# Patient Record
Sex: Female | Born: 2005 | Race: Black or African American | Hispanic: No | Marital: Single | Smoking: Never smoker
Health system: Southern US, Community
[De-identification: ages and names within clinical notes are randomized; demographics above are authoritative.]

## PROBLEM LIST (undated history)

## (undated) ENCOUNTER — Emergency Department (HOSPITAL_COMMUNITY): Payer: Managed Care, Other (non HMO) | Source: Home / Self Care

## (undated) DIAGNOSIS — J302 Other seasonal allergic rhinitis: Secondary | ICD-10-CM

---
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
## 2023-01-19 DIAGNOSIS — R030 Elevated blood-pressure reading, without diagnosis of hypertension: Secondary | ICD-10-CM | POA: Diagnosis not present

## 2023-01-19 DIAGNOSIS — S46912A Strain of unspecified muscle, fascia and tendon at shoulder and upper arm level, left arm, initial encounter: Secondary | ICD-10-CM | POA: Diagnosis not present

## 2023-04-22 ENCOUNTER — Ambulatory Visit
Admission: EM | Admit: 2023-04-22 | Discharge: 2023-04-22 | Disposition: A | Discharge status: Home / Self Care | Payer: Medicaid Other | Attending: Nurse Practitioner | Admitting: Nurse Practitioner

## 2023-04-22 DIAGNOSIS — M436 Torticollis: Secondary | ICD-10-CM | POA: Diagnosis not present

## 2023-04-22 LAB — POCT RAPID STREP A (OFFICE): Rapid Strep A Screen: NEGATIVE

## 2023-04-22 LAB — POCT MONO SCREEN (KUC): Mono, POC: NEGATIVE

## 2023-04-22 MED ORDER — CYCLOBENZAPRINE HCL 5 MG PO TABS: 5.0000 mg | ORAL_TABLET | Freq: Three times a day (TID) | ORAL | 0 refills | Status: DC | PRN

## 2023-04-22 NOTE — ED Provider Notes (Signed)
[facility]    CSN: [id number] Arrival date & time: 04/22/23  1656      History   Chief Complaint Chief Complaint  Patient presents with   neck swelling    HPI Shawna Garcia is a 17 y.o. female.   The history is provided by the patient.   The patient presents for complaints of swelling to the right side of her neck.  Patient states symptoms started this morning after waking up.  Patient states that she has pain when she turns her head to the left side.  She denies fever, chills, headache, sore throat, postnasal drainage, difficulty swallowing, injury or trauma, numbness or tingling in the upper extremities.  Patient states that she normally sleeps on 2 pillows, stating that 1 is a body pillow that she uses most of the night.  Patient reports that she has a history of getting "crick's" in her neck.  She states this is what her symptoms feel like, but not as bad.  Patient reports that she does not engage in oral sex.  Past Medical History:  Diagnosis Date   Seasonal allergies     Patient Active Problem List   Diagnosis Date Noted   Nexplanon insertion 01/04/2022   Seasonal allergies 09/27/2014    History reviewed. No pertinent surgical history.  OB History     Gravida  0   Para  0   Term  0   Preterm  0   AB  0   Living  0      SAB  0   IAB  0   Ectopic  0   Multiple  0   Live Births  0            Home Medications    Prior to Admission medications   Medication Sig Start Date End Date Taking? Authorizing Provider  cyclobenzaprine (FLEXERIL) 5 MG tablet Take 1 tablet (5 mg total) by mouth 3 (three) times daily as needed for muscle spasms. 04/22/23  Yes Reginaldo Hazard-Warren, Sadie Haber, NP    Family History Family History  Problem Relation Age of Onset   Hypertension Paternal Grandfather    Other Paternal Grandmother        internal bleeding   Hypertension Maternal Grandmother    Diabetes Maternal Grandmother    Hyperlipidemia  Maternal Grandmother    Hypertension Maternal Grandfather    Hyperlipidemia Maternal Grandfather    Healthy Father    Healthy Mother    Diabetes Paternal Uncle     Social History Social History   Tobacco Use   Smoking status: Never   Smokeless tobacco: Never  Vaping Use   Vaping Use: Never used  Substance Use Topics   Alcohol use: No   Drug use: No     Allergies   Patient has no known allergies.   Review of Systems Review of Systems Per HPI  Physical Exam Triage Vital Signs ED Triage Vitals [04/22/23 1703]  Enc Vitals Group     BP 115/78     Pulse Rate 79     Resp 20     Temp 99.3 F (37.4 C)     Temp Source Oral     SpO2 98 %     Weight      Height      Head Circumference      Peak Flow      Pain Score 5     Pain Loc      Pain Edu?  Excl. in GC?    No data found.  Updated Vital Signs BP 115/78 (BP Location: Right Arm)   Pulse 79   Temp 99.3 F (37.4 C) (Oral)   Resp 20   LMP 04/09/2023 (Approximate)   SpO2 98%   Visual Acuity Right Eye Distance:   Left Eye Distance:   Bilateral Distance:    Right Eye Near:   Left Eye Near:    Bilateral Near:     Physical Exam Vitals and nursing note reviewed.  Constitutional:      General: She is not in acute distress.    Appearance: Normal appearance.  HENT:     Head: Normocephalic.     Right Ear: Tympanic membrane, ear canal and external ear normal.     Left Ear: Tympanic membrane, ear canal and external ear normal.     Nose: Nose normal.     Mouth/Throat:     Mouth: Mucous membranes are moist.     Pharynx: Posterior oropharyngeal erythema present.  Eyes:     Extraocular Movements: Extraocular movements intact.     Conjunctiva/sclera: Conjunctivae normal.     Pupils: Pupils are equal, round, and reactive to light.  Cardiovascular:     Rate and Rhythm: Normal rate and regular rhythm.     Pulses: Normal pulses.     Heart sounds: Normal heart sounds.  Pulmonary:     Effort: Pulmonary  effort is normal. No respiratory distress.     Breath sounds: Normal breath sounds. No stridor. No wheezing, rhonchi or rales.  Abdominal:     General: Bowel sounds are normal.     Palpations: Abdomen is soft.     Tenderness: There is no abdominal tenderness.  Musculoskeletal:     Cervical back: Rigidity and tenderness present. No erythema. Pain with movement and muscular tenderness present. Decreased range of motion.  Skin:    General: Skin is warm and dry.  Neurological:     General: No focal deficit present.     Mental Status: She is alert and oriented to person, place, and time.  Psychiatric:        Mood and Affect: Mood normal.        Behavior: Behavior normal.      UC Treatments / Results  Labs (all labs ordered are listed, but only abnormal results are displayed) Labs Reviewed  POCT RAPID STREP A (OFFICE)  POCT MONO SCREEN ([facility])    EKG   Radiology No results found.  Procedures Procedures (including critical care time)  Medications Ordered in UC Medications - No data to display  Initial Impression / Assessment and Plan / UC Course  I have reviewed the triage vital signs and the nursing notes.  Pertinent labs & imaging results that were available during my care of the patient were reviewed by me and considered in my medical decision making (see chart for details).  The patient is well-appearing, she is in no acute distress, vital signs are stable.  Rapid strep test and monotest were negative.  Suspect neck stiffness related to acute torticollis.  Patient was treated with Flexeril 5 mg to help with spasm and stiffness.  Supportive care recommendations were provided and discussed with the patient to include use of over-the-counter analgesics for pain or discomfort, the use of ice or heat, and neck exercises.  Patient was given strict ER follow-up precautions.  Patient advised to follow-up with her PCP if symptoms do not improve.  Patient was in agreement with this  plan of care and verbalizes understanding.  All questions were answered.  Patient stable for discharge.  Final Clinical Impressions(s) / UC Diagnoses   Final diagnoses:  Neck stiffness     Discharge Instructions      The rapid strep test and monotest were negative.   Take medication as prescribed. May take over-the-counter ibuprofen as needed for pain or discomfort. Gentle range of motion of the head/neck to help with symptoms.  I have provided exercises for you to perform at least 2-3 times daily while symptoms persist.  You should be moving the head and neck every hour. May apply ice or heat as needed.  Apply ice for pain or swelling, heat for spasm or stiffness.  Apply for 20 minutes, remove for 1 hour, then repeat as needed. Make sure you sleeping to keep neck in alignment while symptoms persist. If you develop difficulty swallowing, trouble breathing, or other concerns, please go to the emergency department immediately. If symptoms do not improve, you will need to follow-up with your primary care physician for further evaluation. Follow-up as needed.     ED Prescriptions     Medication Sig Dispense Auth. Provider   cyclobenzaprine (FLEXERIL) 5 MG tablet Take 1 tablet (5 mg total) by mouth 3 (three) times daily as needed for muscle spasms. 20 tablet Melaya Hoselton-Warren, Sadie Haber, NP      PDMP not reviewed this encounter.   Abran Cantor, NP 04/22/23 1835

## 2023-04-22 NOTE — Discharge Instructions (Addendum)
The rapid strep test and monotest were negative.   Take medication as prescribed. May take over-the-counter ibuprofen as needed for pain or discomfort. Gentle range of motion of the head/neck to help with symptoms.  I have provided exercises for you to perform at least 2-3 times daily while symptoms persist.  You should be moving the head and neck every hour. May apply ice or heat as needed.  Apply ice for pain or swelling, heat for spasm or stiffness.  Apply for 20 minutes, remove for 1 hour, then repeat as needed. Make sure you sleeping to keep neck in alignment while symptoms persist. If you develop difficulty swallowing, trouble breathing, or other concerns, please go to the emergency department immediately. If symptoms do not improve, you will need to follow-up with your primary care physician for further evaluation. Follow-up as needed.

## 2023-04-22 NOTE — ED Triage Notes (Signed)
Pt reports she woke up with neck pain and swelling since this morning. No throat pain or ear pain. Cannot turn head to the left fully without pain.

## 2023-05-09 ENCOUNTER — Ambulatory Visit
Admission: EM | Admit: 2023-05-09 | Discharge: 2023-05-09 | Disposition: A | Discharge status: Home / Self Care | Payer: Medicaid Other | Attending: Family Medicine | Admitting: Family Medicine

## 2023-05-09 DIAGNOSIS — J039 Acute tonsillitis, unspecified: Secondary | ICD-10-CM

## 2023-05-09 LAB — POCT RAPID STREP A (OFFICE): Rapid Strep A Screen: NEGATIVE

## 2023-05-09 MED ORDER — DEXAMETHASONE SODIUM PHOSPHATE 10 MG/ML IJ SOLN
10.0000 mg | Freq: Once | INTRAMUSCULAR | Status: AC
  Administered 2023-05-09: 10 mg via INTRAMUSCULAR

## 2023-05-09 MED ORDER — AMOXICILLIN 875 MG PO TABS: 875.0000 mg | ORAL_TABLET | Freq: Two times a day (BID) | ORAL | 0 refills | Status: DC

## 2023-05-09 NOTE — ED Triage Notes (Signed)
Pt reports she has a sore throat that makes it hard to swallow and nasal congestion x 1 week.  Took cough meds and allergy meds but no relief

## 2023-05-09 NOTE — Discharge Instructions (Signed)
We have given you a shot of steroid today to help with the inflammation in your tonsils and your congestion.  Start the amoxicillin right away for suspected bacterial tonsillitis, take all 10 days as directed and change her toothbrush after about 2 days on the medication.  You are considered not contagious after 24 hours on the medication.

## 2023-05-09 NOTE — ED Provider Notes (Signed)
[facility]    CSN: [id number] Arrival date & time: 05/09/23  0803      History   Chief Complaint Chief Complaint  Patient presents with   Sore Throat    HPI Shawna Garcia is a 17 y.o. female.   Patient presenting today with 1 week history of significant sore throat, difficulty swallowing and runny nose.  Denies fever, chills, body aches, chest pain, shortness of breath, abdominal pain, nausea vomiting or diarrhea.  Has been consistently taking cold congestion medications and antihistamines and Flonase for her seasonal allergies with no relief.  No known sick contacts recently.    Past Medical History:  Diagnosis Date   Seasonal allergies     Patient Active Problem List   Diagnosis Date Noted   Nexplanon insertion 01/04/2022   Seasonal allergies 09/27/2014    History reviewed. No pertinent surgical history.  OB History     Gravida  0   Para  0   Term  0   Preterm  0   AB  0   Living  0      SAB  0   IAB  0   Ectopic  0   Multiple  0   Live Births  0            Home Medications    Prior to Admission medications   Medication Sig Start Date End Date Taking? Authorizing Provider  amoxicillin (AMOXIL) 875 MG tablet Take 1 tablet (875 mg total) by mouth 2 (two) times daily. 05/09/23  Yes Particia Nearing, PA-C  cyclobenzaprine (FLEXERIL) 5 MG tablet Take 1 tablet (5 mg total) by mouth 3 (three) times daily as needed for muscle spasms. 04/22/23   Leath-Warren, Sadie Haber, NP    Family History Family History  Problem Relation Age of Onset   Hypertension Paternal Grandfather    Other Paternal Grandmother        internal bleeding   Hypertension Maternal Grandmother    Diabetes Maternal Grandmother    Hyperlipidemia Maternal Grandmother    Hypertension Maternal Grandfather    Hyperlipidemia Maternal Grandfather    Healthy Father    Healthy Mother    Diabetes Paternal Uncle     Social History Social History   Tobacco  Use   Smoking status: Never   Smokeless tobacco: Never  Vaping Use   Vaping Use: Never used  Substance Use Topics   Alcohol use: No   Drug use: No     Allergies   Patient has no known allergies.   Review of Systems Review of Systems PER HPI  Physical Exam Triage Vital Signs ED Triage Vitals [05/09/23 0815]  Enc Vitals Group     BP 112/72     Pulse Rate (!) 102     Resp 18     Temp 99.2 F (37.3 C)     Temp Source Oral     SpO2 97 %     Weight      Height      Head Circumference      Peak Flow      Pain Score 8     Pain Loc      Pain Edu?      Excl. in GC?    No data found.  Updated Vital Signs BP 112/72 (BP Location: Right Arm)   Pulse (!) 102   Temp 99.2 F (37.3 C) (Oral)   Resp 18   LMP 04/09/2023 (Approximate)  SpO2 97%   Visual Acuity Right Eye Distance:   Left Eye Distance:   Bilateral Distance:    Right Eye Near:   Left Eye Near:    Bilateral Near:     Physical Exam Vitals and nursing note reviewed.  Constitutional:      Appearance: Normal appearance.  HENT:     Head: Atraumatic.     Right Ear: Tympanic membrane and external ear normal.     Left Ear: Tympanic membrane and external ear normal.     Nose: Congestion present.     Mouth/Throat:     Mouth: Mucous membranes are moist.     Pharynx: Oropharyngeal exudate and posterior oropharyngeal erythema present.     Comments: Significant tonsillar erythema, edema, copious exudates.  Uvula midline, orally mildly patent.  Patient in no acute distress Eyes:     Extraocular Movements: Extraocular movements intact.     Conjunctiva/sclera: Conjunctivae normal.  Neck:     Comments: Significant bilateral cervical adenopathy Cardiovascular:     Rate and Rhythm: Normal rate and regular rhythm.     Heart sounds: Normal heart sounds.  Pulmonary:     Effort: Pulmonary effort is normal.     Breath sounds: Normal breath sounds. No wheezing.  Musculoskeletal:        General: Normal range of  motion.     Cervical back: Normal range of motion and neck supple.  Lymphadenopathy:     Cervical: Cervical adenopathy present.  Skin:    General: Skin is warm and dry.  Neurological:     Mental Status: She is alert and oriented to person, place, and time.  Psychiatric:        Mood and Affect: Mood normal.        Thought Content: Thought content normal.      UC Treatments / Results  Labs (all labs ordered are listed, but only abnormal results are displayed) Labs Reviewed  POCT RAPID STREP A (OFFICE)    EKG   Radiology No results found.  Procedures Procedures (including critical care time)  Medications Ordered in UC Medications  dexamethasone (DECADRON) injection 10 mg (10 mg Intramuscular Given 05/09/23 0852)    Initial Impression / Assessment and Plan / UC Course  I have reviewed the triage vital signs and the nursing notes.  Pertinent labs & imaging results that were available during my care of the patient were reviewed by me and considered in my medical decision making (see chart for details).     Mildly tachycardic in triage, otherwise vital signs reassuring.  Exam very suspicious for a bacterial tonsillitis though rapid strep negative.  Treat with IM Decadron given extent of edema, amoxicillin, supportive over-the-counter medications and home care.  Return precautions reviewed.  Final Clinical Impressions(s) / UC Diagnoses   Final diagnoses:  Acute tonsillitis, unspecified etiology     Discharge Instructions      We have given you a shot of steroid today to help with the inflammation in your tonsils and your congestion.  Start the amoxicillin right away for suspected bacterial tonsillitis, take all 10 days as directed and change her toothbrush after about 2 days on the medication.  You are considered not contagious after 24 hours on the medication.    ED Prescriptions     Medication Sig Dispense Auth. Provider   amoxicillin (AMOXIL) 875 MG tablet Take  1 tablet (875 mg total) by mouth 2 (two) times daily. 20 tablet Particia Nearing, [place]  PDMP not reviewed this encounter.   Roosvelt Maser [place], [place] 05/09/23 [phone]

## 2023-06-05 DIAGNOSIS — H5213 Myopia, bilateral: Secondary | ICD-10-CM | POA: Diagnosis not present

## 2023-07-28 DIAGNOSIS — H5213 Myopia, bilateral: Secondary | ICD-10-CM | POA: Diagnosis not present

## 2023-08-07 ENCOUNTER — Ambulatory Visit: Payer: Medicaid Other

## 2023-08-07 ENCOUNTER — Ambulatory Visit
Admission: EM | Admit: 2023-08-07 | Discharge: 2023-08-07 | Disposition: A | Discharge status: Home / Self Care | Payer: Medicaid Other

## 2023-08-07 DIAGNOSIS — M25472 Effusion, left ankle: Secondary | ICD-10-CM | POA: Diagnosis not present

## 2023-08-07 DIAGNOSIS — M25572 Pain in left ankle and joints of left foot: Secondary | ICD-10-CM

## 2023-08-07 NOTE — ED Triage Notes (Signed)
Pt c/o left ankle pain and swelling, tender to the touch,  denies injury x 3 days ago started off with a throbbing. Swelling started Monday night while at work. Pt has been using Ibuprofen for pain and swelling last taken at 6:30 pm

## 2023-08-07 NOTE — Discharge Instructions (Addendum)
The xray did not show a fracture or dislocation. Wear the ankle brace that you have to allow for compression and support. Continue OTC Ibuprofen or Tylenol as needed for pain or discomfort. Continue RICE therapy, rest, ice, compression and elevation. Apply ice for 20 minutes, remove for one hour, repeat as needed. When at work, make sure you are wearing shoes with good insole and support.  It may be helpful to wear hightop sneakers/tennis shoes while symptoms persist. If symptoms have not improved over the next 2 - 3 weeks, follow-up with orthopedics for further evaluation. I have given you information for [facility] (you do not need to make an appointment) and for [facility] [place] (you will need to call to make an appointment). Follow-up as needed.

## 2023-08-07 NOTE — ED Provider Notes (Signed)
[facility]    CSN: [id number] Arrival date & time: 08/07/23  0809      History   Chief Complaint No chief complaint on file.   HPI Shawna Garcia is a 17 y.o. female.   The history is provided by the patient.   Patient presents with a 3-day history of left ankle pain and swelling.  Patient denies injury or trauma, bruising, redness, or inability to bear weight.  Reports that swelling worsens when she is up ambulating and on the ankle.  She denies numbness, tingling, or radiation of pain.  She states that she has sprained the ankle in the past, with the most recent incident occurring approximately 1 year ago.  Patient also reports that the symptoms "comes and goes", and has been doing so for quite some time.  She states "I thought it was the weather", but my symptoms did not improve.  Reports she has been taking ibuprofen, elevating the left lower extremity, and applying ice.  Past Medical History:  Diagnosis Date   Seasonal allergies     Patient Active Problem List   Diagnosis Date Noted   Nexplanon insertion 01/04/2022   Seasonal allergies 09/27/2014    History reviewed. No pertinent surgical history.  OB History     Gravida  0   Para  0   Term  0   Preterm  0   AB  0   Living  0      SAB  0   IAB  0   Ectopic  0   Multiple  0   Live Births  0            Home Medications    Prior to Admission medications   Medication Sig Start Date End Date Taking? Authorizing Provider  montelukast (SINGULAIR) 10 MG tablet Take 10 mg by mouth at bedtime. 05/15/19  Yes [provider]  amoxicillin (AMOXIL) 875 MG tablet Take 1 tablet (875 mg total) by mouth 2 (two) times daily. 05/09/23   Particia Nearing, PA-C  cetirizine (ZYRTEC ALLERGY) 10 MG tablet Take 10 mg by mouth daily.    [provider]  cyclobenzaprine (FLEXERIL) 5 MG tablet Take 1 tablet (5 mg total) by mouth 3 (three) times daily as needed for muscle spasms.  04/22/23   Mattox Schorr-Warren, Sadie Haber, NP  etonogestrel (NEXPLANON) 68 MG IMPL implant 68 mg by Subdermal route once.    [provider]  hydrocortisone 2.5 % ointment Apply topically 2 (two) times daily.    [provider]    Family History Family History  Problem Relation Age of Onset   Hypertension Paternal Grandfather    Other Paternal Grandmother        internal bleeding   Hypertension Maternal Grandmother    Diabetes Maternal Grandmother    Hyperlipidemia Maternal Grandmother    Hypertension Maternal Grandfather    Hyperlipidemia Maternal Grandfather    Healthy Father    Healthy Mother    Diabetes Paternal Uncle     Social History Social History   Tobacco Use   Smoking status: Never   Smokeless tobacco: Never  Vaping Use   Vaping status: Never Used  Substance Use Topics   Alcohol use: No   Drug use: No     Allergies   Patient has no known allergies.   Review of Systems Review of Systems Per HPI  Physical Exam Triage Vital Signs ED Triage Vitals  Encounter Vitals Group  BP 08/07/23 0821 129/82     Systolic BP Percentile --      Diastolic BP Percentile --      Pulse Rate 08/07/23 0821 76     Resp 08/07/23 0821 16     Temp 08/07/23 0821 98.2 F (36.8 C)     Temp Source 08/07/23 0821 Oral     SpO2 08/07/23 0821 98 %     Weight --      Height --      Head Circumference --      Peak Flow --      Pain Score 08/07/23 0825 6     Pain Loc --      Pain Education --      Exclude from Growth Chart --    No data found.  Updated Vital Signs BP 129/82 (BP Location: Right Arm)   Pulse 76   Temp 98.2 F (36.8 C) (Oral)   Resp 16   LMP 07/17/2023 (Exact Date)   SpO2 98%   Visual Acuity Right Eye Distance:   Left Eye Distance:   Bilateral Distance:    Right Eye Near:   Left Eye Near:    Bilateral Near:     Physical Exam Vitals and nursing note reviewed.  Constitutional:      General: She is not in acute distress.     Appearance: Normal appearance.  Eyes:     Extraocular Movements: Extraocular movements intact.     Pupils: Pupils are equal, round, and reactive to light.  Pulmonary:     Effort: Pulmonary effort is normal.  Musculoskeletal:     Cervical back: Normal range of motion.     Left ankle: Swelling present. No deformity or ecchymosis. Tenderness present over the lateral malleolus. Decreased range of motion.  Skin:    General: Skin is warm and dry.  Neurological:     General: No focal deficit present.     Mental Status: She is alert and oriented to person, place, and time.  Psychiatric:        Mood and Affect: Mood normal.        Behavior: Behavior normal.      UC Treatments / Results  Labs (all labs ordered are listed, but only abnormal results are displayed) Labs Reviewed - No data to display  EKG   Radiology DG Ankle Complete Left  Result Date: 08/07/2023 CLINICAL DATA:  Left ankle pain and swelling EXAM: LEFT ANKLE COMPLETE - 3+ VIEW COMPARISON:  05/02/2022 FINDINGS: There is no evidence of fracture, dislocation, or joint effusion. There is no evidence of arthropathy or other focal bone abnormality. Soft tissues are unremarkable. IMPRESSION: Negative. Electronically Signed   By: Judie Petit.  Shick M.D.   On: 08/07/2023 09:07    Procedures Procedures (including critical care time)  Medications Ordered in UC Medications - No data to display  Initial Impression / Assessment and Plan / UC Course  I have reviewed the triage vital signs and the nursing notes.  Pertinent labs & imaging results that were available during my care of the patient were reviewed by me and considered in my medical decision making (see chart for details).  The patient is well-appearing, she is in no acute distress, vital signs are stable.  X-ray of the left ankle is negative for fracture or dislocation.  She does have mild swelling over the lateral malleolus, but has good range of motion.  Difficult to ascertain  the cause of the patient's symptoms at this time.  Patient reports that she does have an ankle brace at home, advised patient to wear the ankle brace to allow for additional compression and support.  Supportive care recommendations were provided and discussed with the patient to include continuing over-the-counter analgesics, and RICE therapy.  Patient was advised that if symptoms are not improving over the next 2 to 3 weeks, or appear to be worsening, would like for her to follow-up with orthopedics.  Patient was given information for [facility] and for [facility].  Patient is in agreement with this plan of care and verbalizes understanding.  All questions were answered.  Patient stable for discharge.  Final Clinical Impressions(s) / UC Diagnoses   Final diagnoses:  Pain and swelling of left ankle     Discharge Instructions      The xray did not show a fracture or dislocation. Wear the ankle brace that you have to allow for compression and support. Continue OTC Ibuprofen or Tylenol as needed for pain or discomfort. Continue RICE therapy, rest, ice, compression and elevation. Apply ice for 20 minutes, remove for one hour, repeat as needed. When at work, make sure you are wearing shoes with good insole and support.  It may be helpful to wear hightop sneakers/tennis shoes while symptoms persist. If symptoms have not improved over the next 2 - 3 weeks, follow-up with orthopedics for further evaluation. I have given you information for [facility] (you do not need to make an appointment) and for [facility] (you will need to call to make an appointment). Follow-up as needed.     ED Prescriptions   None    PDMP not reviewed this encounter.   Abran Cantor, NP 08/07/23 [phone]

## 2023-09-02 ENCOUNTER — Encounter: Payer: Self-pay | Admitting: Advanced Practice Midwife

## 2023-09-02 ENCOUNTER — Ambulatory Visit: Payer: Medicaid Other | Admitting: Advanced Practice Midwife

## 2023-09-02 VITALS — BP 121/81 | HR 72 | Ht 72.0 in | Wt 220.0 lb

## 2023-09-02 DIAGNOSIS — Z30011 Encounter for initial prescription of contraceptive pills: Secondary | ICD-10-CM

## 2023-09-02 DIAGNOSIS — Z3046 Encounter for surveillance of implantable subdermal contraceptive: Secondary | ICD-10-CM | POA: Diagnosis not present

## 2023-09-02 DIAGNOSIS — Z30017 Encounter for initial prescription of implantable subdermal contraceptive: Secondary | ICD-10-CM

## 2023-09-02 MED ORDER — NORETHIN-ETH ESTRAD-FE BIPHAS 1 MG-10 MCG / 10 MCG PO TABS: 1.0000 | ORAL_TABLET | Freq: Every day | ORAL | 4 refills | Status: DC

## 2023-09-02 NOTE — Progress Notes (Signed)
HPI:  Shawna Garcia 17 y.o. here for Nexplanon removal.  She bleeds heavier and longer with it than without it.  Discussed options and goals: would like her periods lighter or gone altogether. Her future plans for birth control are COCs.  Past Medical History: Past Medical History:  Diagnosis Date   Seasonal allergies     Past Surgical History: History reviewed. No pertinent surgical history.  Family History: Family History  Problem Relation Age of Onset   Hypertension Paternal Grandfather    Other Paternal Grandmother        internal bleeding   Hypertension Maternal Grandmother    Diabetes Maternal Grandmother    Hyperlipidemia Maternal Grandmother    Hypertension Maternal Grandfather    Hyperlipidemia Maternal Grandfather    Healthy Father    Healthy Mother    Diabetes Paternal Uncle     Social History: Social History   Tobacco Use   Smoking status: Never   Smokeless tobacco: Never  Vaping Use   Vaping status: Never Used  Substance Use Topics   Alcohol use: No   Drug use: No    Allergies: No Known Allergies  Meds:none    Patient given informed consent for removal of her Nexplanon, time out was performed.  Signed copy in the chart.  Appropriate time out taken. Implanon site identified.  Area prepped in usual sterile fashon. One cc of 1% lidocaine was used to anesthetize the area at the distal end of the implant. A small stab incision was made right beside the implant on the distal portion.  The Nexplanon rod was grasped using hemostats and removed without difficulty.  There was less than 3 cc blood loss. There were no complications.  A small amount of antibiotic ointment and steri-strips were applied over the small incision.  A pressure bandage was applied to reduce any bruising.  The patient tolerated the procedure well and was given post procedure instructions.  Rx for  Meds ordered this encounter  Medications   Norethindrone-Ethinyl Estradiol-Fe Biphas (LO  LOESTRIN FE) 1 MG-10 MCG / 10 MCG tablet    Sig: Take 1 tablet by mouth daily.    Dispense:  84 tablet    Refill:  4    Order Specific Question:   Supervising Provider    Answer:   Lazaro Arms [[id number]]   Start today. F/U 3 months

## 2023-09-16 ENCOUNTER — Ambulatory Visit
Admission: EM | Admit: 2023-09-16 | Discharge: 2023-09-16 | Disposition: A | Discharge status: Home / Self Care | Payer: Medicaid Other | Attending: Family Medicine | Admitting: Family Medicine

## 2023-09-16 DIAGNOSIS — S39012A Strain of muscle, fascia and tendon of lower back, initial encounter: Secondary | ICD-10-CM | POA: Diagnosis not present

## 2023-09-16 MED ORDER — DEXAMETHASONE SODIUM PHOSPHATE 10 MG/ML IJ SOLN
10.0000 mg | Freq: Once | INTRAMUSCULAR | Status: AC
Start: 2023-09-16 — End: 2023-09-16
  Administered 2023-09-16: 10 mg via INTRAMUSCULAR

## 2023-09-16 NOTE — ED Triage Notes (Signed)
Pt c/o back pain that's radiating down into legs x 3 days, pt denies any injury to her back to cause the pain. Has tingling in the legs. Staes she has tried Ice/heat, muscle relaxants and tylenol/ Ibuprofen. Nothing has alleviated the pain she is feeling.

## 2023-09-16 NOTE — ED Provider Notes (Signed)
[facility]    CSN: [id number] Arrival date & time: 09/16/23  1014      History   Chief Complaint No chief complaint on file.   HPI Shawna Garcia is a 17 y.o. female.   Patient presenting today with 3 history of bilateral low back pain radiating down the back of both legs.  Denies known injury, bowel or bladder incontinence, saddle anesthesias, leg weakness numbness or tingling, fever.  So far trying over-the-counter pain relievers, muscle relaxers with minimal relief.  Denies past history of the same.    Past Medical History:  Diagnosis Date   Seasonal allergies     Patient Active Problem List   Diagnosis Date Noted   Seasonal allergies 09/27/2014    History reviewed. No pertinent surgical history.  OB History     Gravida  0   Para  0   Term  0   Preterm  0   AB  0   Living  0      SAB  0   IAB  0   Ectopic  0   Multiple  0   Live Births  0            Home Medications    Prior to Admission medications   Medication Sig Start Date End Date Taking? Authorizing Provider  amoxicillin (AMOXIL) 875 MG tablet Take 1 tablet (875 mg total) by mouth 2 (two) times daily. 05/09/23   Particia Nearing, PA-C  cetirizine (ZYRTEC ALLERGY) 10 MG tablet Take 10 mg by mouth daily.    [provider]  cyclobenzaprine (FLEXERIL) 5 MG tablet Take 1 tablet (5 mg total) by mouth 3 (three) times daily as needed for muscle spasms. Patient not taking: Reported on 09/02/2023 04/22/23   Leath-Warren, Sadie Haber, NP  hydrocortisone 2.5 % ointment Apply topically 2 (two) times daily.    [provider]  montelukast (SINGULAIR) 10 MG tablet Take 10 mg by mouth at bedtime. Patient not taking: Reported on 09/02/2023 05/15/19   [provider]  Norethindrone-Ethinyl Estradiol-Fe Biphas (LO LOESTRIN FE) 1 MG-10 MCG / 10 MCG tablet Take 1 tablet by mouth daily. 09/02/23   Cresenzo-Dishmon, Scarlette Calico, CNM    Family History Family  History  Problem Relation Age of Onset   Hypertension Paternal Grandfather    Other Paternal Grandmother        internal bleeding   Hypertension Maternal Grandmother    Diabetes Maternal Grandmother    Hyperlipidemia Maternal Grandmother    Hypertension Maternal Grandfather    Hyperlipidemia Maternal Grandfather    Healthy Father    Healthy Mother    Diabetes Paternal Uncle     Social History Social History   Tobacco Use   Smoking status: Never   Smokeless tobacco: Never  Vaping Use   Vaping status: Never Used  Substance Use Topics   Alcohol use: No   Drug use: No     Allergies   Patient has no known allergies.   Review of Systems Review of Systems Per HPI  Physical Exam Triage Vital Signs ED Triage Vitals  Encounter Vitals Group     BP 09/16/23 1045 120/80     Systolic BP Percentile --      Diastolic BP Percentile --      Pulse Rate 09/16/23 1045 71     Resp 09/16/23 1045 19     Temp 09/16/23 1045 98.3 F (36.8 C)     Temp Source 09/16/23 1045  Oral     SpO2 09/16/23 1045 98 %     Weight --      Height --      Head Circumference --      Peak Flow --      Pain Score 09/16/23 1049 8     Pain Loc --      Pain Education --      Exclude from Growth Chart --    No data found.  Updated Vital Signs BP 120/80 (BP Location: Right Arm)   Pulse 71   Temp 98.3 F (36.8 C) (Oral)   Resp 19   LMP 08/14/2023 (Exact Date)   SpO2 98%   Visual Acuity Right Eye Distance:   Left Eye Distance:   Bilateral Distance:    Right Eye Near:   Left Eye Near:    Bilateral Near:     Physical Exam Vitals and nursing note reviewed.  Constitutional:      Appearance: Normal appearance. She is not ill-appearing.  HENT:     Head: Atraumatic.  Eyes:     Extraocular Movements: Extraocular movements intact.     Conjunctiva/sclera: Conjunctivae normal.  Cardiovascular:     Rate and Rhythm: Normal rate and regular rhythm.     Heart sounds: Normal heart sounds.   Pulmonary:     Effort: Pulmonary effort is normal.     Breath sounds: Normal breath sounds.  Musculoskeletal:        General: Tenderness present. No swelling, deformity or signs of injury. Normal range of motion.     Cervical back: Normal range of motion and neck supple.     Comments: No midline spinal tenderness to palpation diffusely.  Bilateral lumbar musculature laterally tender to palpation.  Negative straight leg raise bilateral lower extremities.  Normal gait and range of motion  Skin:    General: Skin is warm and dry.  Neurological:     Mental Status: She is alert and oriented to person, place, and time.     Motor: No weakness.     Gait: Gait normal.     Comments: Bilateral lower extremities neurovascularly intact  Psychiatric:        Mood and Affect: Mood normal.        Thought Content: Thought content normal.        Judgment: Judgment normal.      UC Treatments / Results  Labs (all labs ordered are listed, but only abnormal results are displayed) Labs Reviewed - No data to display  EKG   Radiology No results found.  Procedures Procedures (including critical care time)  Medications Ordered in UC Medications  dexamethasone (DECADRON) injection 10 mg (10 mg Intramuscular Given 09/16/23 1116)    Initial Impression / Assessment and Plan / UC Course  I have reviewed the triage vital signs and the nursing notes.  Pertinent labs & imaging results that were available during my care of the patient were reviewed by me and considered in my medical decision making (see chart for details).     Consistent with muscular strain, piriformis syndrome.  Treat with IM Decadron, continued muscle relaxers and over-the-counter pain relievers.  Discussed supportive home care and return precautions.  Final Clinical Impressions(s) / UC Diagnoses   Final diagnoses:  Strain of lumbar region, initial encounter   Discharge Instructions   None    ED Prescriptions   None     PDMP not reviewed this encounter.   Particia Nearing, [place] 09/16/23 1349

## 2023-09-23 ENCOUNTER — Encounter: Payer: Self-pay | Admitting: Advanced Practice Midwife

## 2023-10-29 ENCOUNTER — Encounter: Payer: Self-pay | Admitting: Advanced Practice Midwife

## 2023-12-03 ENCOUNTER — Encounter: Payer: Medicaid Other | Admitting: Women's Health

## 2023-12-03 ENCOUNTER — Encounter: Payer: Self-pay | Admitting: Women's Health

## 2023-12-04 ENCOUNTER — Encounter: Payer: Self-pay | Admitting: Women's Health

## 2023-12-04 ENCOUNTER — Ambulatory Visit (INDEPENDENT_AMBULATORY_CARE_PROVIDER_SITE_OTHER): Payer: Self-pay | Admitting: Women's Health

## 2023-12-04 VITALS — BP 121/73 | Ht 72.0 in | Wt 249.0 lb

## 2023-12-04 DIAGNOSIS — Z3041 Encounter for surveillance of contraceptive pills: Secondary | ICD-10-CM

## 2023-12-04 DIAGNOSIS — N946 Dysmenorrhea, unspecified: Secondary | ICD-10-CM

## 2023-12-04 DIAGNOSIS — N920 Excessive and frequent menstruation with regular cycle: Secondary | ICD-10-CM

## 2023-12-04 NOTE — Progress Notes (Signed)
   GYN VISIT Patient name: Shawna Garcia MRN [id number]  Date of birth: 20-Dec-2005 Chief Complaint:   Follow-up (Birth control)  History of Present Illness:   Shawna Garcia is a 18 y.o. G0P0000 African-American female being seen today for f/u on COCs. Started on LoLo 10/28 at time of Nexplanon removal.  Had Nexplanon placed 01/05/23 to help heavy painful bleeding and unfortunately it did the opposite. Periods have already started to become lighter since beginning LoLo.  Patient's last menstrual period was 11/24/2023. The current method of family planning is OCP (estrogen/progesterone).  Last pap <21yo. Results were: N/A     01/04/2022    8:55 AM  Depression screen PHQ 2/9  Decreased Interest 0  Down, Depressed, Hopeless 0  PHQ - 2 Score 0  Altered sleeping 0  Tired, decreased energy 0  Change in appetite 0  Feeling bad or failure about yourself  0  Trouble concentrating 1  Moving slowly or fidgety/restless 0  Suicidal thoughts 0  PHQ-9 Score 1        01/04/2022    8:56 AM  GAD 7 : Generalized Anxiety Score  Nervous, Anxious, on Edge 0  Control/stop worrying 0  Worry too much - different things 0  Trouble relaxing 0  Restless 0  Easily annoyed or irritable 0  Afraid - awful might happen 0  Total GAD 7 Score 0     Review of Systems:   Pertinent items are noted in HPI Denies fever/chills, dizziness, headaches, visual disturbances, fatigue, shortness of breath, chest pain, abdominal pain, vomiting, abnormal vaginal discharge/itching/odor/irritation, problems with periods, bowel movements, urination, or intercourse unless otherwise stated above.  Pertinent History Reviewed:  Reviewed past medical,surgical, social, obstetrical and family history.  Reviewed problem list, medications and allergies. Physical Assessment:   Vitals:   12/04/23 1417  BP: 121/73  Weight: 249 lb (112.9 kg)  Height: 6' (1.829 m)  Body mass index is 33.77 kg/m.       Physical Examination:    General appearance: alert, well appearing, and in no distress  Mental status: alert, oriented to person, place, and time  Skin: warm & dry   Cardiovascular: normal heart rate noted  Respiratory: normal respiratory effort, no distress  Abdomen: soft, non-tender   Pelvic: examination not indicated  Extremities: no edema   Chaperone: N/A    No results found for this or any previous visit (from the past 24 hours).  Assessment & Plan:  1) Heavy painful periods> improving on LoLo, f/u 51yr  Meds: No orders of the defined types were placed in this encounter.   No orders of the defined types were placed in this encounter.   Return in about 1 year (around 12/03/2024) for med f/u.  Cheral Marker CNM, [facility] 12/04/2023 2:47 PM

## 2023-12-04 NOTE — Progress Notes (Signed)
This encounter was created in error - please disregard. Appt for COC f/u, didn't have bp cuff so will reschedule for in person.

## 2024-01-08 ENCOUNTER — Ambulatory Visit (INDEPENDENT_AMBULATORY_CARE_PROVIDER_SITE_OTHER): Payer: Self-pay

## 2024-01-08 ENCOUNTER — Encounter: Payer: Self-pay | Admitting: Emergency Medicine

## 2024-01-08 ENCOUNTER — Ambulatory Visit
Admission: EM | Admit: 2024-01-08 | Discharge: 2024-01-08 | Disposition: A | Discharge status: Home / Self Care | Payer: Self-pay | Attending: Nurse Practitioner | Admitting: Nurse Practitioner

## 2024-01-08 DIAGNOSIS — S6991XA Unspecified injury of right wrist, hand and finger(s), initial encounter: Secondary | ICD-10-CM

## 2024-01-08 NOTE — ED Provider Notes (Signed)
 [facility]    CSN: [id number] Arrival date & time: 01/08/24  0804      History   Chief Complaint No chief complaint on file.   HPI Shawna Garcia is a 18 y.o. female.   The history is provided by the patient.   Patient presents for complaints of right wrist pain that is been present for the past 1 to 2 weeks.  Patient states that she fell onto the right wrist with an outstretched hand.  She states since that time, she has had intermittent swelling and pain.  She states the pain starts approximately midway on top of the wrist and radiates about midway down under the wrist on the side closest to her.  States that she initially used an Ace wrap for compression, but felt that her wrist was moving too much, and then transition to a brace.  She reports mild numbness in the right hand.  Denies tingling, decreased movement in the fingers, or decrease sensation.  Patient reports she is right-hand dominant. Past Medical History:  Diagnosis Date   Seasonal allergies     Patient Active Problem List   Diagnosis Date Noted   Seasonal allergies 09/27/2014    History reviewed. No pertinent surgical history.  OB History     Gravida  0   Para  0   Term  0   Preterm  0   AB  0   Living  0      SAB  0   IAB  0   Ectopic  0   Multiple  0   Live Births  0            Home Medications    Prior to Admission medications   Medication Sig Start Date End Date Taking? Authorizing Provider  cetirizine (ZYRTEC ALLERGY) 10 MG tablet Take 10 mg by mouth daily.    [provider]  Norethindrone-Ethinyl Estradiol-Fe Biphas (LO LOESTRIN FE) 1 MG-10 MCG / 10 MCG tablet Take 1 tablet by mouth daily. 09/02/23   Cresenzo-Dishmon, Scarlette Calico, CNM    Family History Family History  Problem Relation Age of Onset   Hypertension Paternal Grandfather    Other Paternal Grandmother        internal bleeding   Hypertension Maternal Grandmother    Diabetes Maternal  Grandmother    Hyperlipidemia Maternal Grandmother    Hypertension Maternal Grandfather    Hyperlipidemia Maternal Grandfather    Healthy Father    Healthy Mother    Diabetes Paternal Uncle     Social History Social History   Tobacco Use   Smoking status: Never   Smokeless tobacco: Never  Vaping Use   Vaping status: Never Used  Substance Use Topics   Alcohol use: No   Drug use: No     Allergies   Patient has no known allergies.   Review of Systems Review of Systems Per HPI  Physical Exam Triage Vital Signs ED Triage Vitals  Encounter Vitals Group     BP 01/08/24 0821 122/84     Systolic BP Percentile --      Diastolic BP Percentile --      Pulse Rate 01/08/24 0821 70     Resp 01/08/24 0821 18     Temp 01/08/24 0821 98.6 F (37 C)     Temp Source 01/08/24 0821 Oral     SpO2 01/08/24 0821 98 %     Weight --      Height --  Head Circumference --      Peak Flow --      Pain Score 01/08/24 0822 8     Pain Loc --      Pain Education --      Exclude from Growth Chart --    No data found.  Updated Vital Signs BP 122/84 (BP Location: Right Arm)   Pulse 70   Temp 98.6 F (37 C) (Oral)   Resp 18   LMP 12/14/2023 (Approximate)   SpO2 98%   Visual Acuity Right Eye Distance:   Left Eye Distance:   Bilateral Distance:    Right Eye Near:   Left Eye Near:    Bilateral Near:     Physical Exam Vitals and nursing note reviewed.  Constitutional:      General: She is not in acute distress.    Appearance: Normal appearance.  Eyes:     Extraocular Movements: Extraocular movements intact.     Pupils: Pupils are equal, round, and reactive to light.  Cardiovascular:     Rate and Rhythm: Normal rate and regular rhythm.     Pulses: Normal pulses.     Heart sounds: Normal heart sounds.  Pulmonary:     Effort: Pulmonary effort is normal. No respiratory distress.     Breath sounds: Normal breath sounds. No stridor. No wheezing, rhonchi or rales.  Abdominal:      General: Bowel sounds are normal.     Palpations: Abdomen is soft.     Tenderness: There is no abdominal tenderness.  Musculoskeletal:     Right wrist: Tenderness (Generalized) present. No swelling, deformity or snuff box tenderness. Decreased range of motion (Passive range of motion with pain). Normal pulse.     Right hand: Normal.     Cervical back: Normal range of motion.  Lymphadenopathy:     Cervical: No cervical adenopathy.  Skin:    General: Skin is warm and dry.  Neurological:     General: No focal deficit present.     Mental Status: She is alert and oriented to person, place, and time.  Psychiatric:        Mood and Affect: Mood normal.        Behavior: Behavior normal.      UC Treatments / Results  Labs (all labs ordered are listed, but only abnormal results are displayed) Labs Reviewed - No data to display  EKG   Radiology No results found.  Procedures Procedures (including critical care time)  Medications Ordered in UC Medications - No data to display  Initial Impression / Assessment and Plan / UC Course  I have reviewed the triage vital signs and the nursing notes.  Pertinent labs & imaging results that were available during my care of the patient were reviewed by me and considered in my medical decision making (see chart for details).  X-ray of the right wrist is pending.  Preliminary read of x-ray did not show any obvious fractures, awaiting official read from radiologist.  At a minimum, suspect a sprain of the right wrist.  Supportive care recommendations were provided and discussed with the patient to include RICE therapy, and over-the-counter analgesics.  Patient advised to continue use of the brace.  Range of motion exercises were also provided.  Discussed indications for follow-up with the patient.  Patient was given information for Ortho care and for [facility] if symptoms fail to improve.  Patient was in agreement with this plan of care and  verbalized understanding.  All  questions were answered.  Patient stable for discharge.  Work note was provided.  Update 0943am: Reviewed x-ray results.  Call patient to discuss, verified patient with 2 patient identifiers.  Advised patient that x-ray was negative for fracture or dislocation.  Advised patient we will continue with current plan treatment plan.  Advised patient's that if symptoms do not improve, or appear to be worsening, follow-up with orthopedics as planned.  Patient was in agreement with this plan of care and verbalized understanding.  All questions were answered.  Final Clinical Impressions(s) / UC Diagnoses   Final diagnoses:  None   Discharge Instructions   None    ED Prescriptions   None    PDMP not reviewed this encounter.   Abran Cantor, NP 01/08/24 [phone]

## 2024-01-08 NOTE — ED Triage Notes (Signed)
 Right wrist pain x 2 weeks.  Fell on a cement floor 2 weeks ago.  Has been wearing a wrist brace and icing without relief.

## 2024-01-08 NOTE — Discharge Instructions (Signed)
 X-ray of the right wrist is pending.  You will be contacted when the results of the x-ray are received.  You also access to the results via MyChart. May take over-the-counter Tylenol or ibuprofen as needed for pain, or general discomfort. Continue using the brace when you are engaged in prolonged or strenuous activity.  Remove the brace throughout the day and perform gentle range of motion exercises to help decrease your recovery time.  Avoid heavy lifting while symptoms persist. RICE therapy, rest, ice, compression, elevation.  Apply ice for 20 minutes, remove for 1 hour, repeat as needed. If symptoms are failing to improve over the next 1 to 2 weeks, or appear to be worsening, would like for you to follow-up with orthopedics for further evaluation. Follow-up as needed.

## 2024-03-13 ENCOUNTER — Encounter: Payer: Self-pay | Admitting: Advanced Practice Midwife

## 2024-03-16 ENCOUNTER — Other Ambulatory Visit: Payer: Self-pay | Admitting: Women's Health

## 2024-03-16 MED ORDER — NORETHIN ACE-ETH ESTRAD-FE 1-20 MG-MCG(24) PO TABS: 1.0000 | ORAL_TABLET | Freq: Every day | ORAL | 3 refills | Status: AC

## 2024-10-25 ENCOUNTER — Ambulatory Visit
Admission: EM | Admit: 2024-10-25 | Discharge: 2024-10-25 | Disposition: A | Discharge status: Home / Self Care | Attending: Family Medicine | Admitting: Family Medicine

## 2024-10-25 ENCOUNTER — Ambulatory Visit

## 2024-10-25 ENCOUNTER — Encounter: Payer: Self-pay | Admitting: Emergency Medicine

## 2024-10-25 DIAGNOSIS — M7662 Achilles tendinitis, left leg: Secondary | ICD-10-CM

## 2024-10-25 MED ORDER — TIZANIDINE HCL 4 MG PO TABS: 4.0000 mg | ORAL_TABLET | Freq: Three times a day (TID) | ORAL | 0 refills | Status: AC | PRN

## 2024-10-25 MED ORDER — PREDNISONE 20 MG PO TABS: 40.0000 mg | ORAL_TABLET | Freq: Every day | ORAL | 0 refills | Status: AC

## 2024-10-25 NOTE — ED Triage Notes (Signed)
 Left ankle and back of foot pain x 3 days.  No known injury.  Has noticed some swelling. States hurts to walk and move toes.

## 2024-10-25 NOTE — ED Provider Notes (Signed)
 " [facility]    CSN: [id number] Arrival date & time: 10/25/24  1149      History   Chief Complaint No chief complaint on file.   HPI Shawna Garcia is a 18 y.o. female.   Patient presenting today with 3-day history of left posterior ankle pain worse with flexing the foot.  States she works on her feet on concrete all day and thinks this may be what is causing the pain.  Denies redness, significant swelling, direct injury to the area, new shoes, numbness, weakness, tingling.  So far trying ice, ibuprofen, massage with minimal relief.    Past Medical History:  Diagnosis Date   Seasonal allergies     Patient Active Problem List   Diagnosis Date Noted   Seasonal allergies 09/27/2014    History reviewed. No pertinent surgical history.  OB History     Gravida  0   Para  0   Term  0   Preterm  0   AB  0   Living  0      SAB  0   IAB  0   Ectopic  0   Multiple  0   Live Births  0            Home Medications    Prior to Admission medications  Medication Sig Start Date End Date Taking? Authorizing Provider  predniSONE  (DELTASONE ) 20 MG tablet Take 2 tablets (40 mg total) by mouth daily with breakfast. 10/25/24  Yes Stuart Vernell Norris, PA-C  tiZANidine  (ZANAFLEX ) 4 MG tablet Take 1 tablet (4 mg total) by mouth every 8 (eight) hours as needed for muscle spasms. Do not drink alcohol or drive while taking this medication.  May cause drowsiness. 10/25/24  Yes Stuart Vernell Norris, PA-C  cetirizine  (ZYRTEC  ALLERGY) 10 MG tablet Take 10 mg by mouth daily.    [provider]  Norethindrone Acetate-Ethinyl Estrad-FE (LOESTRIN 24 FE) 1-20 MG-MCG(24) tablet Take 1 tablet by mouth daily. 03/16/24   Kizzie Suzen SAUNDERS, CNM    Family History Family History  Problem Relation Age of Onset   Hypertension Paternal Grandfather    Other Paternal Grandmother        internal bleeding   Hypertension Maternal Grandmother    Diabetes Maternal  Grandmother    Hyperlipidemia Maternal Grandmother    Hypertension Maternal Grandfather    Hyperlipidemia Maternal Grandfather    Healthy Father    Healthy Mother    Diabetes Paternal Uncle     Social History Social History[1]   Allergies   Patient has no known allergies.   Review of Systems Review of Systems Per HPI  Physical Exam Triage Vital Signs ED Triage Vitals  Encounter Vitals Group     BP 10/25/24 1248 137/83     Girls Systolic BP Percentile --      Girls Diastolic BP Percentile --      Boys Systolic BP Percentile --      Boys Diastolic BP Percentile --      Pulse Rate 10/25/24 1248 77     Resp 10/25/24 1248 18     Temp 10/25/24 1248 98.5 F (36.9 C)     Temp Source 10/25/24 1248 Oral     SpO2 10/25/24 1248 97 %     Weight --      Height --      Head Circumference --      Peak Flow --      Pain Score  10/25/24 1249 5     Pain Loc --      Pain Education --      Exclude from Growth Chart --    No data found.  Updated Vital Signs BP 137/83 (BP Location: Right Arm)   Pulse 77   Temp 98.5 F (36.9 C) (Oral)   Resp 18   LMP 09/28/2024 (Approximate)   SpO2 97%   Visual Acuity Right Eye Distance:   Left Eye Distance:   Bilateral Distance:    Right Eye Near:   Left Eye Near:    Bilateral Near:     Physical Exam Vitals and nursing note reviewed.  Constitutional:      Appearance: Normal appearance. She is not ill-appearing.  HENT:     Head: Atraumatic.  Eyes:     Extraocular Movements: Extraocular movements intact.     Conjunctiva/sclera: Conjunctivae normal.  Cardiovascular:     Rate and Rhythm: Normal rate.  Pulmonary:     Effort: Pulmonary effort is normal.  Musculoskeletal:        General: Tenderness present. No swelling, deformity or signs of injury. Normal range of motion.     Cervical back: Normal range of motion and neck supple.     Comments: Localized tenderness to palpation over the insertion of the left Achilles tendon.  No bony  deformity palpable, range of motion intact  Skin:    General: Skin is warm and dry.     Findings: No bruising or erythema.  Neurological:     Mental Status: She is alert and oriented to person, place, and time.     Comments: Left lower extremity neurovascularly intact  Psychiatric:        Mood and Affect: Mood normal.        Thought Content: Thought content normal.        Judgment: Judgment normal.      UC Treatments / Results  Labs (all labs ordered are listed, but only abnormal results are displayed) Labs Reviewed - No data to display  EKG   Radiology DG Ankle Complete Left Result Date: 10/25/2024 EXAM: 3 OR MORE VIEW(S) XRAY OF THE LEFT ANKLE 10/25/2024 01:00:29 PM CLINICAL HISTORY: pain and swelling x 3 days COMPARISON: None available. FINDINGS: BONES AND JOINTS: No acute fracture. No widening of the ankle mortise and the talar dome is intact. SOFT TISSUES: The soft tissues are unremarkable. IMPRESSION: 1. No acute fracture or dislocation. Electronically signed by: Rogelia Myers MD 10/25/2024 01:05 PM EST RP Workstation: [id number]    Procedures Procedures (including critical care time)  Medications Ordered in UC Medications - No data to display  Initial Impression / Assessment and Plan / UC Course  I have reviewed the triage vital signs and the nursing notes.  Pertinent labs & imaging results that were available during my care of the patient were reviewed by me and considered in my medical decision making (see chart for details).     X-ray of the left ankle negative for acute bony abnormality.  Suspicious for Achilles tendinitis.  Treat with a short course of prednisone , Zanaflex , RICE, supportive shoes, plenty of stretching.  Work note given.  Return for worsening or unresolving symptoms.  Final Clinical Impressions(s) / UC Diagnoses   Final diagnoses:  Achilles tendinitis of left lower extremity   Discharge Instructions   None    ED Prescriptions      Medication Sig Dispense Auth. Provider   predniSONE  (DELTASONE ) 20 MG tablet Take 2 tablets (40  mg total) by mouth daily with breakfast. 6 tablet Stuart Vernell Norris, PA-C   tiZANidine  (ZANAFLEX ) 4 MG tablet Take 1 tablet (4 mg total) by mouth every 8 (eight) hours as needed for muscle spasms. Do not drink alcohol or drive while taking this medication.  May cause drowsiness. 15 tablet Stuart Vernell Norris, [place]      PDMP not reviewed this encounter.    [1]  Social History Tobacco Use   Smoking status: Never   Smokeless tobacco: Never  Vaping Use   Vaping status: Never Used  Substance Use Topics   Alcohol use: No   Drug use: No     Stuart Vernell Norris, PA-C 10/25/24 1348  "
